# Patient Record
Sex: Female | Born: 1995 | Race: White | Hispanic: No | Marital: Single | Smoking: Never smoker
Health system: Southern US, Community
[De-identification: ages and names within clinical notes are randomized; demographics above are authoritative.]

---

## 2017-04-03 ENCOUNTER — Ambulatory Visit (HOSPITAL_COMMUNITY)
Admission: EM | Admit: 2017-04-03 | Discharge: 2017-04-03 | Disposition: A | Discharge status: Home / Self Care | Payer: 59 | Attending: Internal Medicine | Admitting: Internal Medicine

## 2017-04-03 ENCOUNTER — Ambulatory Visit (INDEPENDENT_AMBULATORY_CARE_PROVIDER_SITE_OTHER): Payer: 59

## 2017-04-03 ENCOUNTER — Encounter (HOSPITAL_COMMUNITY): Payer: Self-pay | Admitting: Family Medicine

## 2017-04-03 DIAGNOSIS — J209 Acute bronchitis, unspecified: Secondary | ICD-10-CM

## 2017-04-03 MED ORDER — PREDNISONE 50 MG PO TABS: ORAL_TABLET | ORAL | 0 refills | Status: AC

## 2017-04-03 MED ORDER — ALBUTEROL SULFATE HFA 108 (90 BASE) MCG/ACT IN AERS: 1.0000 | INHALATION_SPRAY | Freq: Four times a day (QID) | RESPIRATORY_TRACT | 0 refills | Status: AC | PRN

## 2017-04-03 MED ORDER — AZITHROMYCIN 250 MG PO TABS: 250.0000 mg | ORAL_TABLET | Freq: Every day | ORAL | 0 refills | Status: AC

## 2017-04-03 MED ORDER — BENZONATATE 100 MG PO CAPS: 100.0000 mg | ORAL_CAPSULE | Freq: Three times a day (TID) | ORAL | 0 refills | Status: AC

## 2017-04-03 NOTE — ED Triage Notes (Signed)
Pt here for cough, congestion and green mucous. sts pain in her chest from coughing and trouble sleeping.

## 2017-04-03 NOTE — ED Provider Notes (Signed)
CSN: [id number]     Arrival date & time 04/03/17  1221 History   First MD Initiated Contact with Patient 04/03/17 1324     Chief Complaint  Patient presents with  . Cough   (Consider location/radiation/quality/duration/timing/severity/associated sxs/prior Treatment) The history is provided by the patient.  Cough  Cough characteristics:  Non-productive, dry, hacking and harsh Sputum characteristics:  Clear and green Severity:  Moderate Onset quality:  Gradual Duration:  3 weeks Timing:  Constant Progression:  Worsening Chronicity:  New Smoker: no   Context: animal exposure, exposure to allergens and smoke exposure   Context: not occupational exposure, not sick contacts, not upper respiratory infection, not weather changes and not with activity   Relieved by:  Nothing Worsened by:  Lying down Ineffective treatments:  Cough suppressants and decongestant Associated symptoms: shortness of breath and wheezing   Associated symptoms: no chest pain, no chills, no ear fullness, no ear pain, no fever, no headaches, no rhinorrhea, no sinus congestion and no weight loss     History reviewed. No pertinent past medical history. History reviewed. No pertinent surgical history. History reviewed. No pertinent family history. Social History  Substance Use Topics  . Smoking status: Never Smoker  . Smokeless tobacco: Never Used  . Alcohol use Not on file   OB History    No data available     Review of Systems  Constitutional: Negative for chills, fever and weight loss.  HENT: Negative for ear pain and rhinorrhea.   Respiratory: Positive for cough, shortness of breath and wheezing.   Cardiovascular: Negative for chest pain and palpitations.  Gastrointestinal: Negative.   Genitourinary: Negative.   Musculoskeletal: Negative.   Skin: Negative.   Neurological: Negative for light-headedness and headaches.    Allergies  Patient has no known allergies.  Home Medications   Prior to  Admission medications   Medication Sig Start Date End Date Taking? Authorizing Provider  albuterol (PROVENTIL HFA;VENTOLIN HFA) 108 (90 Base) MCG/ACT inhaler Inhale 1-2 puffs into the lungs every 6 (six) hours as needed for wheezing or shortness of breath. 04/03/17   Dorena BodoKennard, Jacolyn Joaquin, NP  azithromycin (ZITHROMAX) 250 MG tablet Take 1 tablet (250 mg total) by mouth daily. Take first 2 tablets together, then 1 every day until finished. 04/03/17   Dorena BodoKennard, Franshesca Chipman, NP  benzonatate (TESSALON) 100 MG capsule Take 1 capsule (100 mg total) by mouth every 8 (eight) hours. 04/03/17   Dorena BodoKennard, Grete Bosko, NP  predniSONE (DELTASONE) 50 MG tablet Take 1 tablet daily with food 04/03/17   Dorena BodoKennard, Deeana Atwater, NP   Meds Ordered and Administered this Visit  Medications - No data to display  BP 128/81 (BP Location: Left Arm)   Pulse 80   Temp 98.4 F (36.9 C) (Oral)   Resp 16   LMP 03/20/2017 (Exact Date)   SpO2 100%  No data found.   Physical Exam  Constitutional: She is oriented to person, place, and time. She appears well-developed and well-nourished. No distress.  HENT:  Head: Normocephalic and atraumatic.  Right Ear: External ear normal.  Left Ear: External ear normal.  Eyes: Conjunctivae are normal.  Neck: Normal range of motion.  Cardiovascular: Normal rate and regular rhythm.   Pulmonary/Chest: Effort normal and breath sounds normal.  Abdominal: Soft. Bowel sounds are normal.  Lymphadenopathy:    She has no cervical adenopathy.  Neurological: She is alert and oriented to person, place, and time.  Skin: Skin is warm and dry. Capillary refill takes less than 2 seconds. No  rash noted. She is not diaphoretic. No erythema.  Psychiatric: She has a normal mood and affect. Her behavior is normal.  Nursing note and vitals reviewed.   Urgent Care Course     Procedures (including critical care time)  Labs Review Labs Reviewed - No data to display  Imaging Review Dg Chest 2 View  Result Date:  04/03/2017 CLINICAL DATA:  Persistent cough for the past 3 weeks, productive in the evenings. No fever. EXAM: CHEST  2 VIEW COMPARISON:  None. FINDINGS: Normal sized heart. Clear lungs. Mild central peribronchial thickening. Mild scoliosis. IMPRESSION: Mild bronchitic changes. Electronically Signed   By: Beckie Salts M.D.   On: 04/03/2017 13:46      MDM   1. Acute bronchitis, unspecified organism    Treating for acute bronchitis, started on azithromycin, Tessalon, prednisone, and given albuterol. Recommend over-the-counter antihistamine daily, given referral to community health and wellness, return to clinic as needed.    Dorena Bodo, NP 04/03/17 2026

## 2017-04-03 NOTE — Discharge Instructions (Signed)
I am treating you for bronchitis. I have prescribed Azithromycin. Take 2 tablets today, then 1 tablet daily till finished. I have also prescribed a steroid called prednisone. Take one tablet daily with food. For cough, I have prescribed a medication called Tessalon. Take 1 tablet every 8 hours as needed for your cough. I also provided an albuterol inhaler, do 1-2 puffs every 4-6 hours as needed for wheezing and shortness of breath. I also recommend an over-the-counter antihistamine such as Claritin, Allegra, or Zyrtec, daily for the remainder of allergy season. Should your symptoms fail to improve or worsen, follow up with your primary care provider, or return to clinic.

## 2019-10-13 ENCOUNTER — Other Ambulatory Visit: Payer: Self-pay | Admitting: Cardiology

## 2019-10-13 DIAGNOSIS — Z20822 Contact with and (suspected) exposure to covid-19: Secondary | ICD-10-CM

## 2019-10-14 LAB — NOVEL CORONAVIRUS, NAA: SARS-CoV-2, NAA: NOT DETECTED

## 2020-12-24 ENCOUNTER — Other Ambulatory Visit: Payer: Self-pay | Admitting: Family Medicine

## 2020-12-24 ENCOUNTER — Ambulatory Visit
Admission: RE | Admit: 2020-12-24 | Discharge: 2020-12-24 | Disposition: A | Discharge status: Home / Self Care | Payer: BC Managed Care – PPO | Source: Ambulatory Visit | Attending: Family Medicine | Admitting: Family Medicine

## 2020-12-24 ENCOUNTER — Other Ambulatory Visit: Payer: Self-pay

## 2020-12-24 DIAGNOSIS — Z1231 Encounter for screening mammogram for malignant neoplasm of breast: Secondary | ICD-10-CM

## 2020-12-27 ENCOUNTER — Other Ambulatory Visit: Payer: Self-pay | Admitting: Family Medicine

## 2020-12-27 DIAGNOSIS — R928 Other abnormal and inconclusive findings on diagnostic imaging of breast: Secondary | ICD-10-CM

## 2020-12-31 ENCOUNTER — Ambulatory Visit
Admission: RE | Admit: 2020-12-31 | Discharge: 2020-12-31 | Disposition: A | Discharge status: Home / Self Care | Payer: BC Managed Care – PPO | Source: Ambulatory Visit | Attending: Family Medicine | Admitting: Family Medicine

## 2020-12-31 ENCOUNTER — Other Ambulatory Visit: Payer: Self-pay | Admitting: Family Medicine

## 2020-12-31 ENCOUNTER — Other Ambulatory Visit: Payer: Self-pay

## 2020-12-31 DIAGNOSIS — R928 Other abnormal and inconclusive findings on diagnostic imaging of breast: Secondary | ICD-10-CM

## 2021-01-02 ENCOUNTER — Ambulatory Visit
Admission: RE | Admit: 2021-01-02 | Discharge: 2021-01-02 | Disposition: A | Discharge status: Home / Self Care | Payer: BC Managed Care – PPO | Source: Ambulatory Visit | Attending: Family Medicine | Admitting: Family Medicine

## 2021-01-02 ENCOUNTER — Other Ambulatory Visit: Payer: Self-pay

## 2021-01-02 DIAGNOSIS — R928 Other abnormal and inconclusive findings on diagnostic imaging of breast: Secondary | ICD-10-CM

## 2021-07-07 ENCOUNTER — Other Ambulatory Visit: Payer: BC Managed Care – PPO

## 2022-03-09 IMAGING — US US BREAST*L* LIMITED INC AXILLA
1 series · 9 of 9 positions shown · non-contrast
Comparison: 12/24/2020

CLINICAL DATA: Patient returns after baseline screening study for
evaluation of possible bilateral breast masses. Patient's mother was
diagnosed with breast cancer at age 35. Genetic testing was not
performed on her mother. The patient and her sister may be
interested in genetic testing.



[Series 1: us breast*left* limited inc axilla · 0.06mm/px · 9 of 9 slices shown]
[im 1/9]
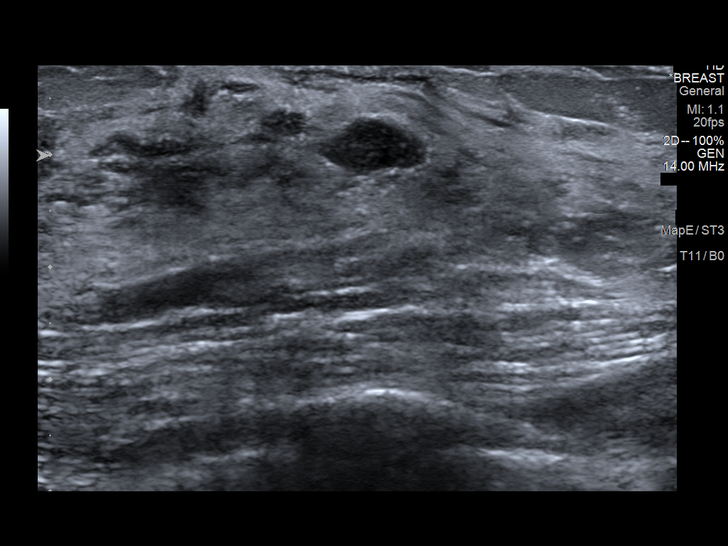
[im 2/9]
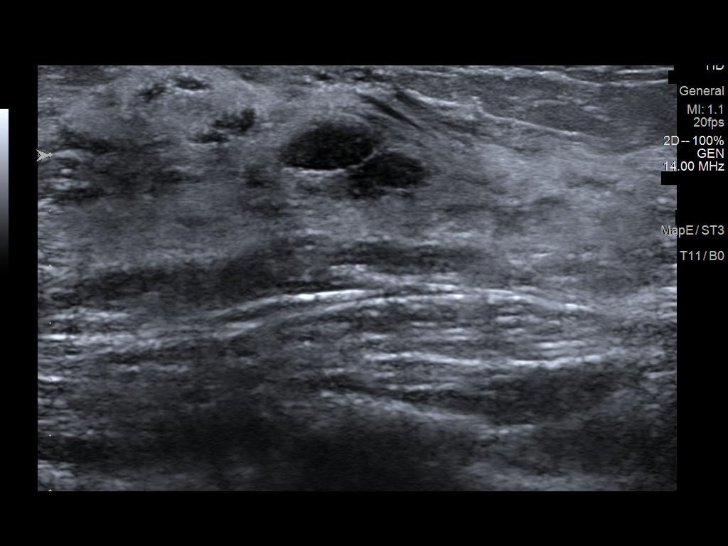
[im 3/9]
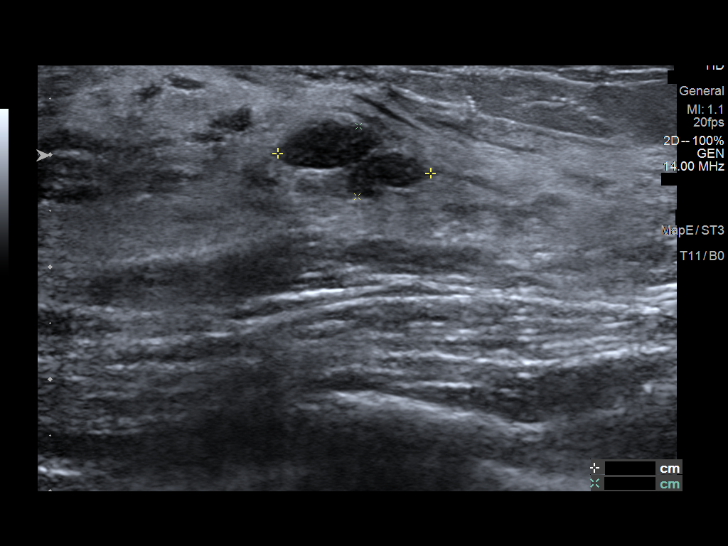
[im 4/9]
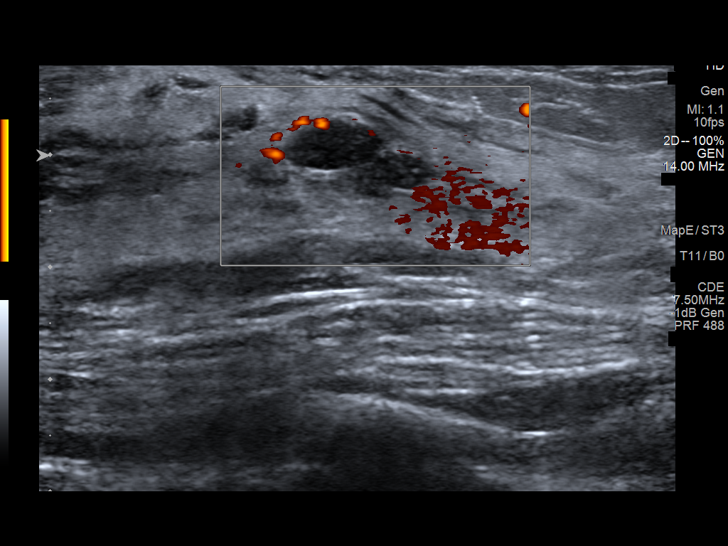
[im 5/9]
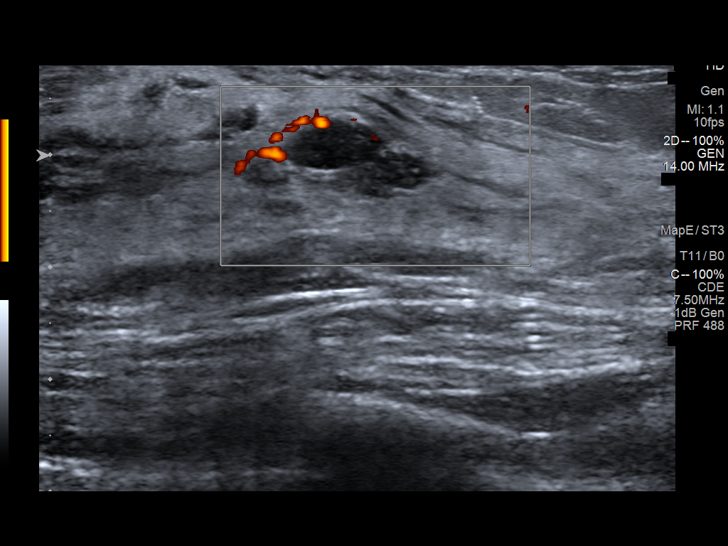
[im 6/9]
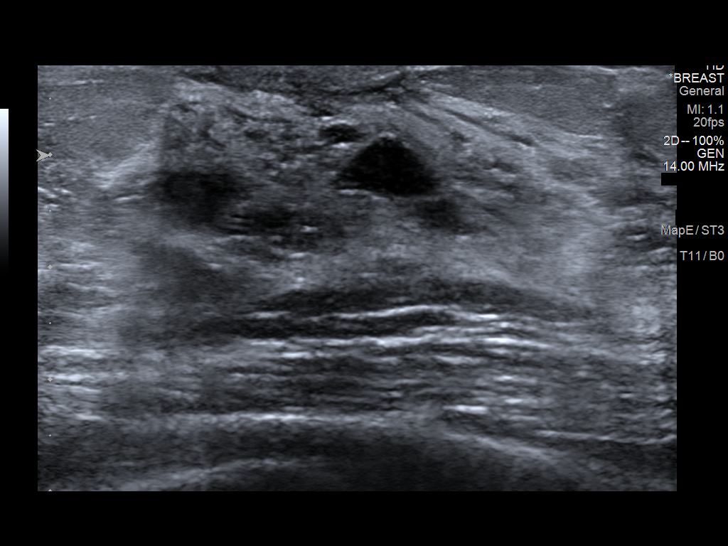
[im 7/9]
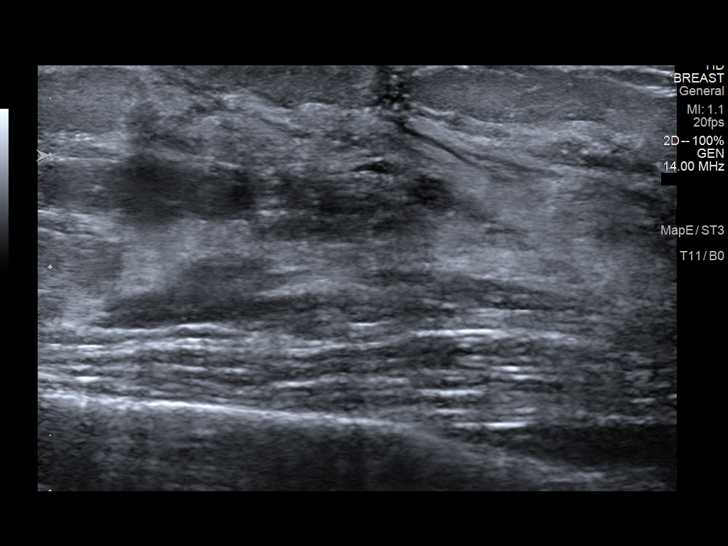
[im 8/9]
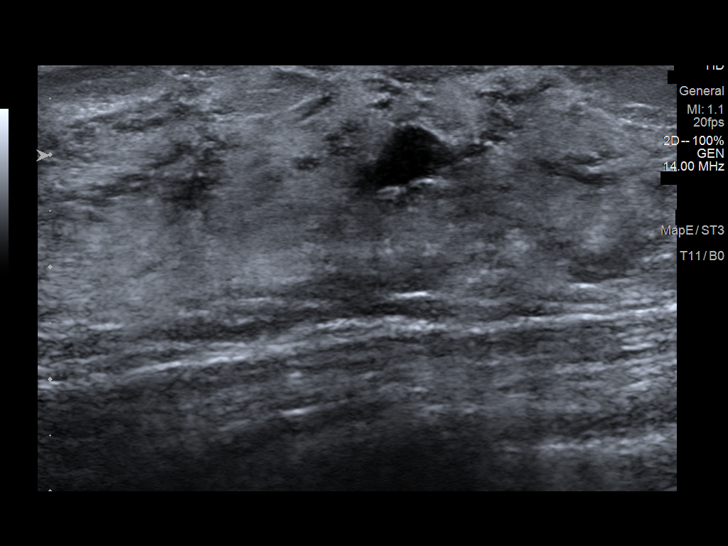
[im 9/9]
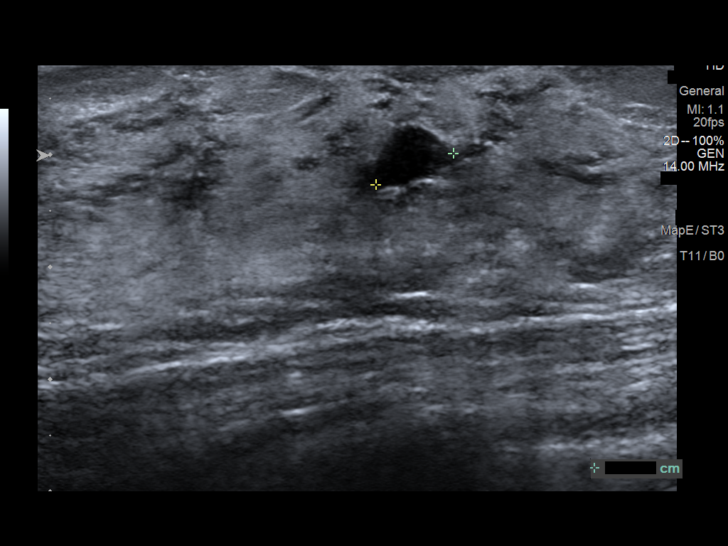

[9 of 9 positions shown; findings below may reference images not displayed]

ACR Breast Density Category c: The breast tissue is heterogeneously
dense, which may obscure small masses.
FINDINGS: RIGHT BREAST:

Mammogram: Additional 2-D and 3-D images are performed. These views
confirm presence of an oval circumscribed mass in the LATERAL
portion of the RIGHT breast and evaluated with ultrasound.
Mammographic images were processed with CAD.

Ultrasound: Targeted ultrasound is performed, showing a
circumscribed oval hypoechoic parallel mass in the 9:30 o'clock
location of the RIGHT breast 5 centimeters from the nipple measuring
1.1 x 0.7 x 1.1 centimeters. No internal blood flow identified by
Doppler evaluation.

LEFT BREAST:

Mammogram: Additional 2-D and 3-D images are performed. These views
show possible obscured mass in the immediate retroareolar location.
Mammographic images were processed with CAD.

Ultrasound: Targeted ultrasound is performed, showing a bilobed
hypoechoic oval circumscribed parallel mass in the 1 o'clock
retroareolar region of the LEFT breast which measures 1.4 x 0.6 x
0.7 centimeters. Internal blood flow is confirmed on Doppler
evaluation.
IMPRESSION: 1. Probably benign mass in the 9:30 o'clock location of the RIGHT
breast.
2. Probably benign mass in the 1 o'clock retroareolar region of the
LEFT breast.
3. We discussed management options including excision, biopsy, and
close follow-up. Given the patient's strong family history of breast
cancer, she elects ultrasound-guided core biopsy of both lesions.

RECOMMENDATION:
Ultrasound-guided core biopsy of RIGHT breast mass 9:30 o'clock
location.

Ultrasound-guided core biopsy of LEFT breast mass 1 o'clock
location.

Recommend targeted axillary ultrasound to exclude axillary
adenopathy prior to biopsies as this was not performed today.

I have discussed the findings and recommendations with the patient.
If applicable, a reminder letter will be sent to the patient
regarding the next appointment.

BI-RADS CATEGORY  3: Probably benign.

## 2022-03-11 IMAGING — US US BREAST BX W LOC DEV 1ST LESION IMG BX SPEC US GUIDE*L*
2 series · 11 of 11 positions shown · non-contrast
Comparison: Previous exam(s).
COMPARISON: Previous exam(s).

Addendum:
CLINICAL DATA: 24-year-old female with strong family history of
breast cancer and bilateral breast masses, in the right breast at
the [DATE] position and in the left breast at the 1 o'clock position.

EXAM:
ULTRASOUND GUIDED BILATERAL BREAST CORE NEEDLE BIOPSY

[Series 1: us breast bx w loc dev 1st lesion img bx spec us g · 0.06mm/px · 9 of 9 slices shown (1 of 2)]
[im 1/9]
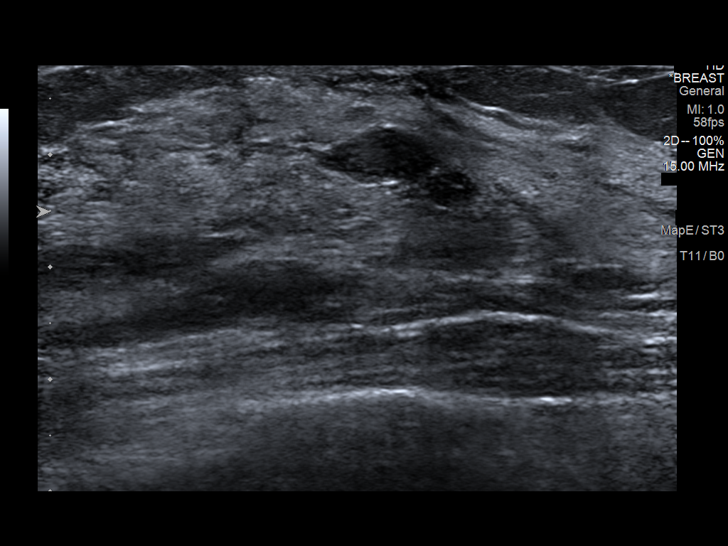
[im 2/9]
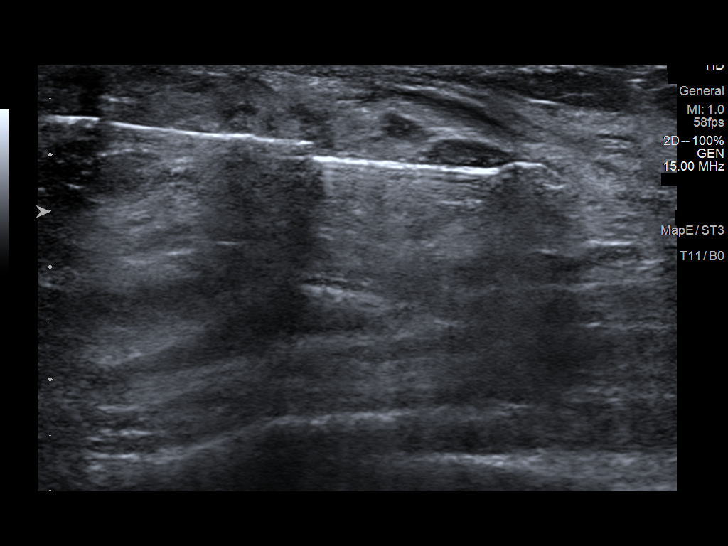
[im 3/9]
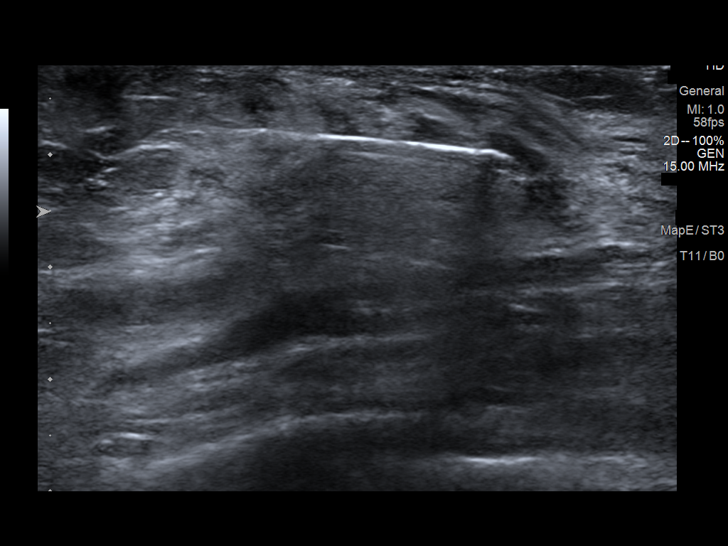
[im 4/9]
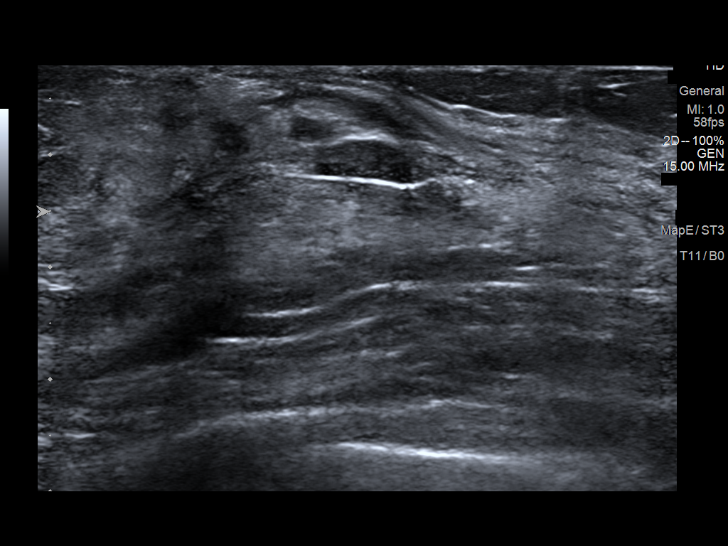
[im 5/9]
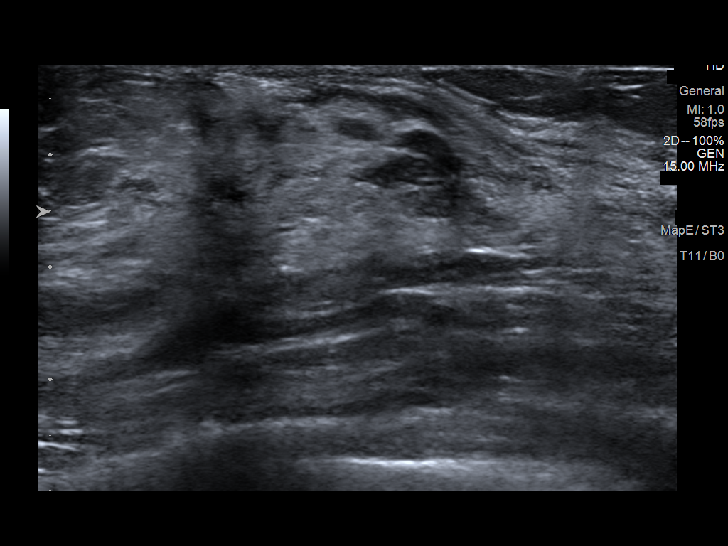
[im 6/9]
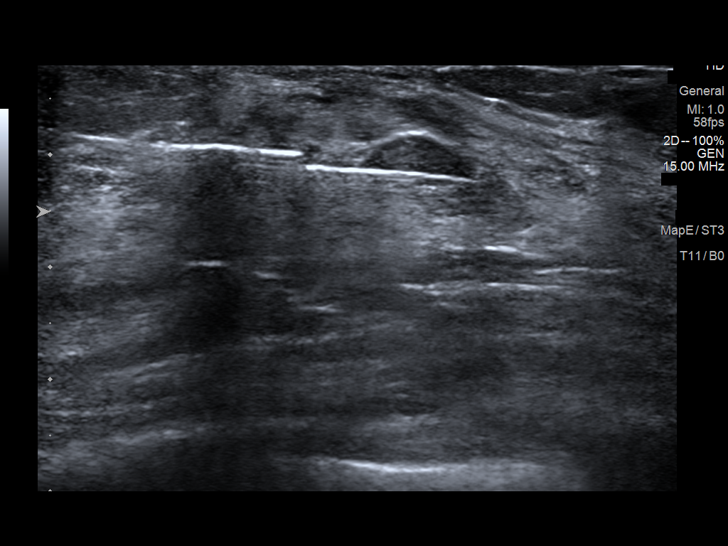
[im 7/9]
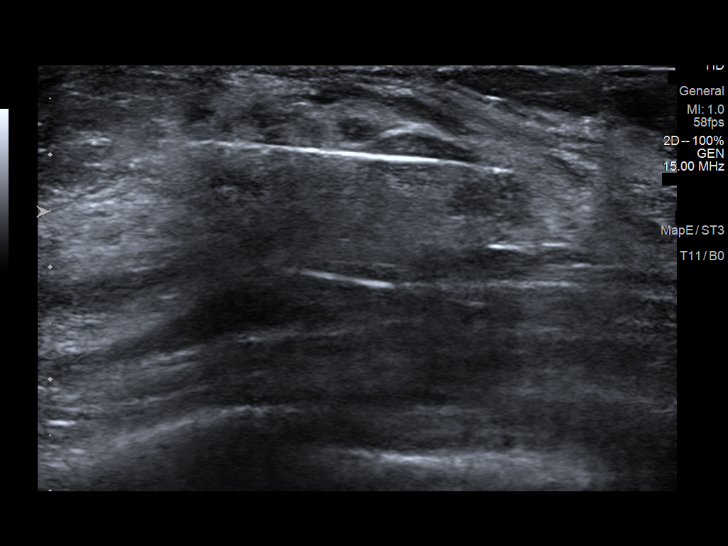
[im 8/9]
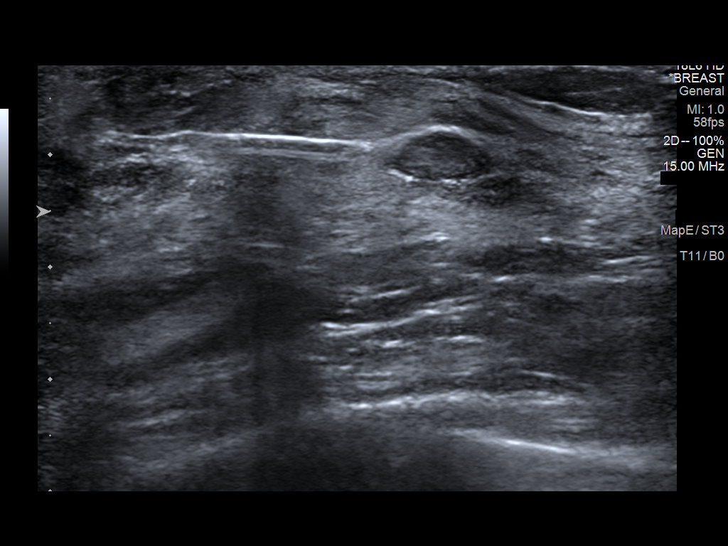
[im 9/9]
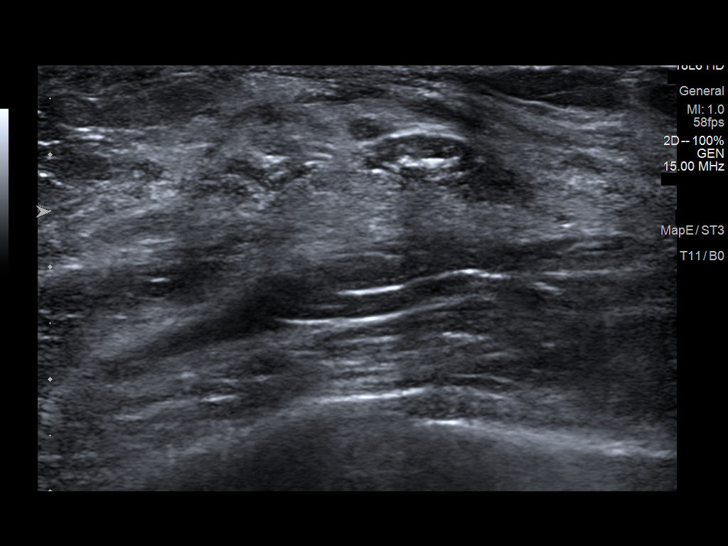

[Series 2: us breast bx w loc dev 1st lesion img bx spec us g · 0.08mm/px · 2 of 2 slices shown (2 of 2)]
[im 1/2]
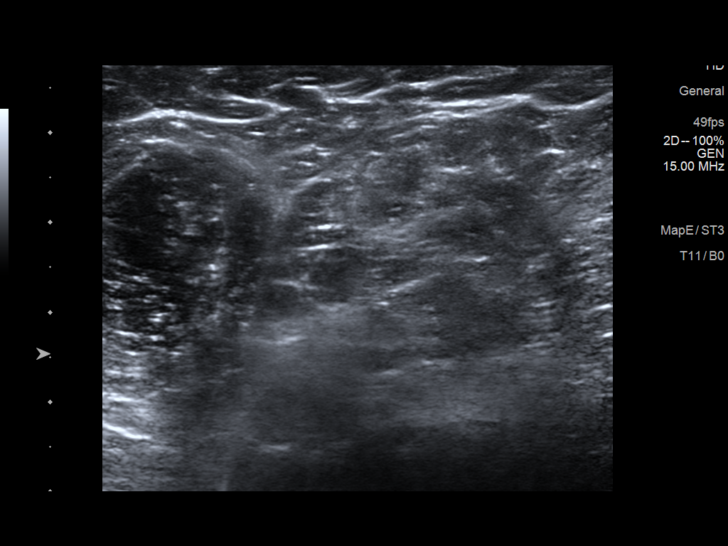
[im 2/2]
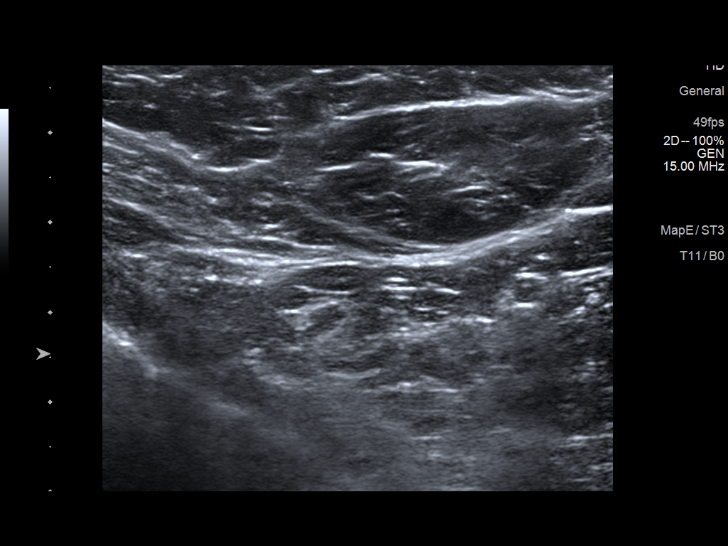

[11 of 11 positions shown; findings below may reference images not displayed]



BILATERAL AXILLA ULTRASOUND: Prior to proceeding with bilateral
breast biopsies, bilateral axillary ultrasound was performed. No
morphologically abnormal lymph nodes identified in either axilla.

RIGHT BREAST [DATE] POSITION: MASS: Lesion quadrant: UPPER-OUTER

Using sterile technique and 1% Lidocaine as local anesthetic, under
direct ultrasound visualization, a 14 gauge [place] device was
used to perform biopsy of the mass in the right breast at the [DATE]
position using a lateral to medial approach. At the conclusion of
the procedure a coil shaped tissue marker clip was deployed into the
biopsy cavity. Post biopsy mammogram was not performed.

LEFT BREAST 1 O'CLOCK POSITION: MASS: Lesion quadrant: UPPER-OUTER

Using sterile technique and 1% Lidocaine as local anesthetic, under
direct ultrasound visualization, a 14 gauge [place] device was
used to perform biopsy of the mass in the left breast at the 1
o'clock position using a medial to lateral approach. At the
conclusion of the procedure a coil shaped tissue marker clip was
deployed into the biopsy cavity. A post biopsy mammogram was not
performed.
IMPRESSION: 1. Ultrasound-guided biopsy of the mass in the right breast at the
[DATE] position.

2. Ultrasound-guided biopsy of the mass in the left breast at the 1
o'clock position.

3.  No axillary lymphadenopathy.

ADDENDUM:
Pathology revealed FIBROADENOMA of the RIGHT breast, 9:30 o'clock.
This was found to be concordant by Dr. Kohhei Revoredo.

Pathology revealed FIBROADENOMA of the LEFT breast, 1 o'clock. This
was found to be concordant by Dr. Kohhei Revoredo.

Pathology results were discussed with the patient by telephone. The
patient reported doing well after the biopsies with tenderness at
the sites. Post biopsy instructions and care were reviewed and
questions were answered. The patient was encouraged to call The

Recommendation is screening mammography in one year and Genetics
testing given family history of breast cancer.

Pathology results reported by Geibson De Holanda RN on 01/06/2021.



BILATERAL AXILLA ULTRASOUND: Prior to proceeding with bilateral
breast biopsies, bilateral axillary ultrasound was performed. No
morphologically abnormal lymph nodes identified in either axilla.

RIGHT BREAST [DATE] POSITION: MASS: Lesion quadrant: UPPER-OUTER

Using sterile technique and 1% Lidocaine as local anesthetic, under
direct ultrasound visualization, a 14 gauge [place] device was
used to perform biopsy of the mass in the right breast at the [DATE]
position using a lateral to medial approach. At the conclusion of
the procedure a coil shaped tissue marker clip was deployed into the
biopsy cavity. Post biopsy mammogram was not performed.

LEFT BREAST 1 O'CLOCK POSITION: MASS: Lesion quadrant: UPPER-OUTER

Using sterile technique and 1% Lidocaine as local anesthetic, under
direct ultrasound visualization, a 14 gauge [place] device was
used to perform biopsy of the mass in the left breast at the 1
o'clock position using a medial to lateral approach. At the
conclusion of the procedure a coil shaped tissue marker clip was
deployed into the biopsy cavity. A post biopsy mammogram was not
performed.
IMPRESSION: 1. Ultrasound-guided biopsy of the mass in the right breast at the
[DATE] position.

2. Ultrasound-guided biopsy of the mass in the left breast at the 1
o'clock position.

3.  No axillary lymphadenopathy.

## 2022-03-11 IMAGING — US US  BREAST BX W/ LOC DEV 1ST LESION IMG BX SPEC US GUIDE*R*
2 series · 16 of 18 positions shown · non-contrast
Comparison: Previous exam(s).
COMPARISON: Previous exam(s).

Addendum:
CLINICAL DATA: 24-year-old female with strong family history of
breast cancer and bilateral breast masses, in the right breast at
the [DATE] position and in the left breast at the 1 o'clock position.

EXAM:
ULTRASOUND GUIDED BILATERAL BREAST CORE NEEDLE BIOPSY

[Series 1: us breast bx w/ loc dev 1st lesion img bx spec us  · 0.07mm/px · 12 of 13 slices shown (1 of 2)]
[im 1/13]
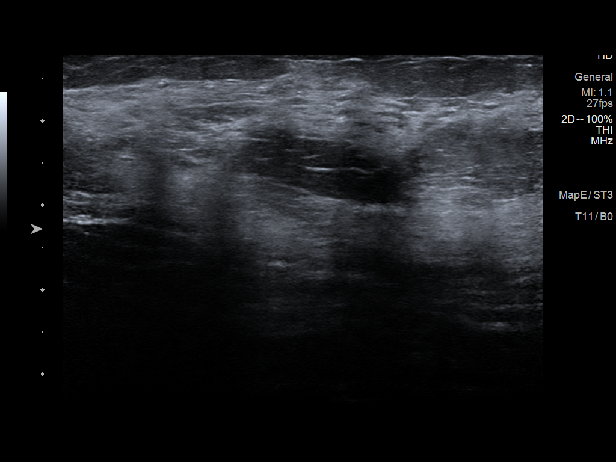
[im 2/13]
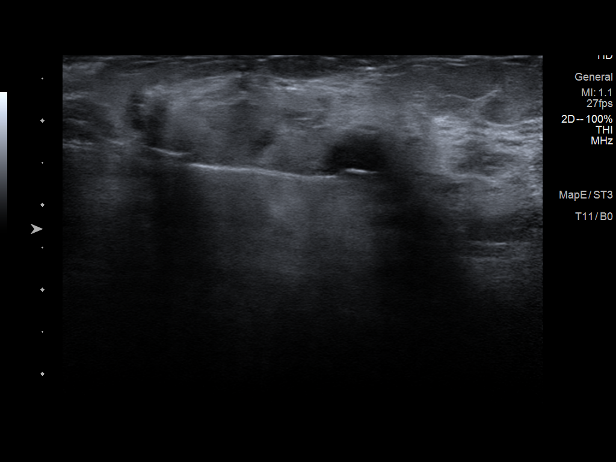
[im 3/13]
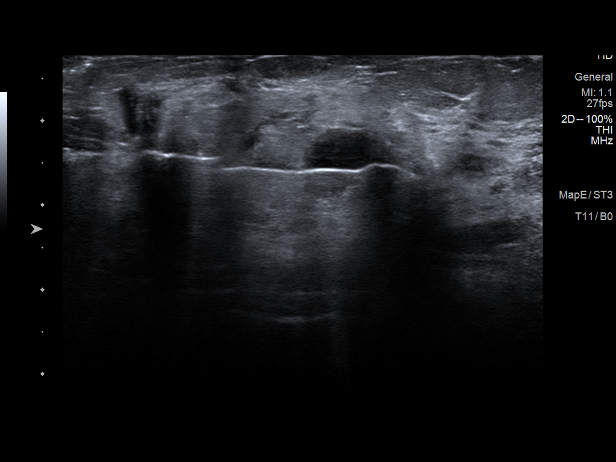
[im 4/13]
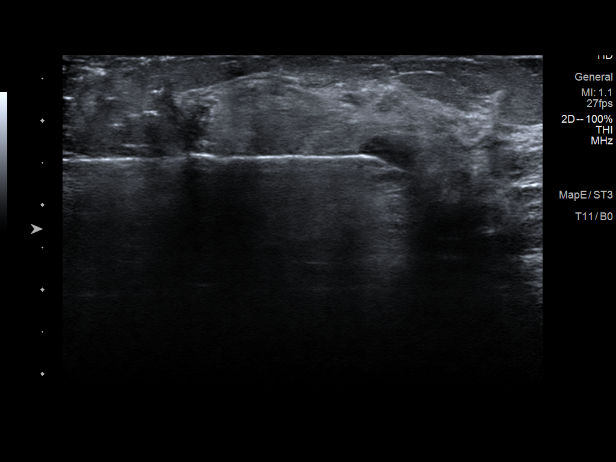
[im 6/13]
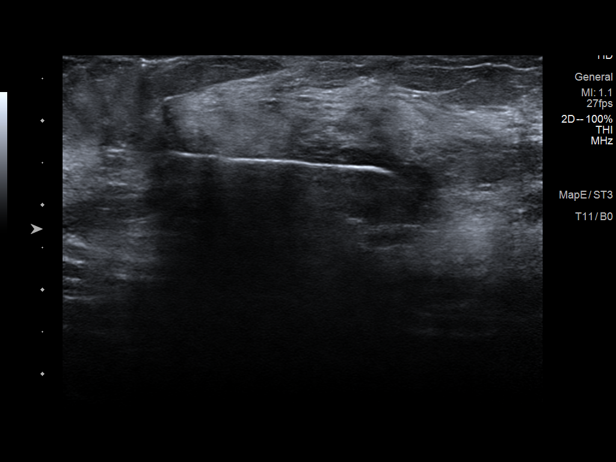
[im 7/13]
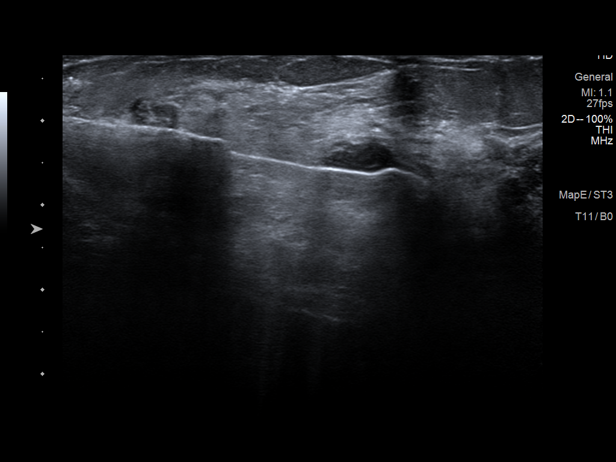
[im 8/13]
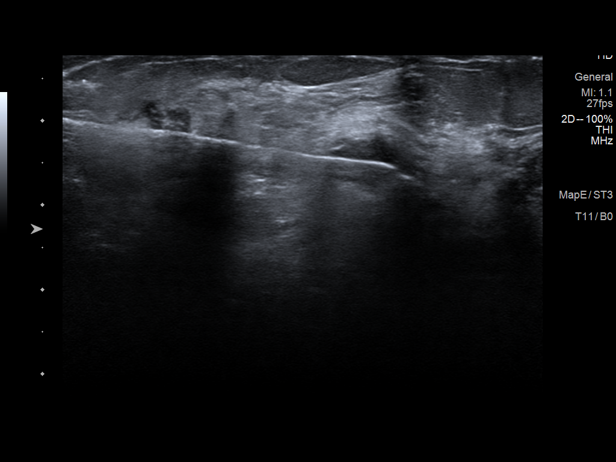
[im 9/13]
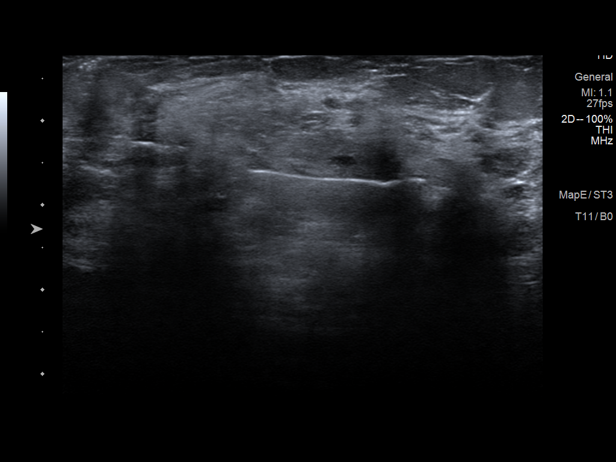
[im 10/13]
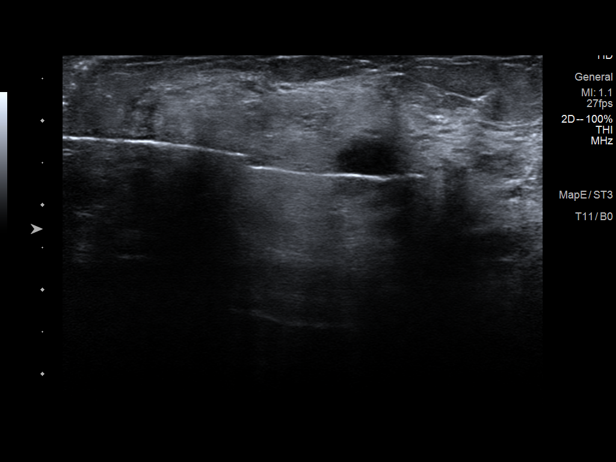
[im 11/13]
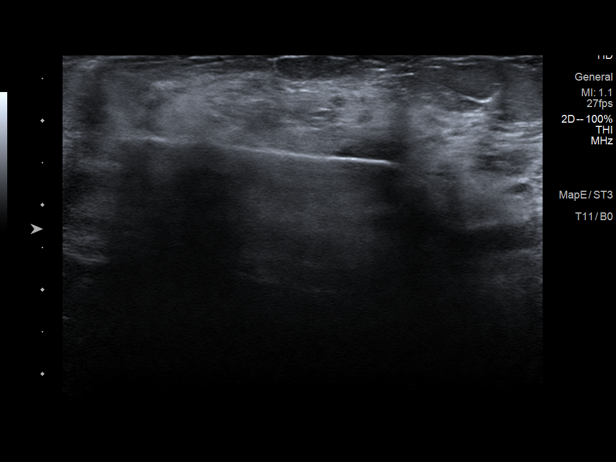
[im 12/13]
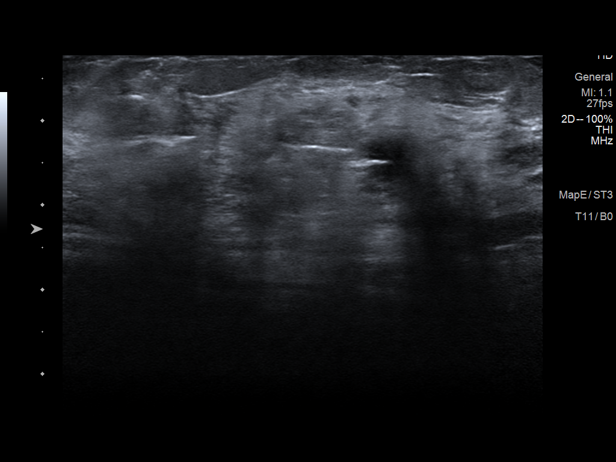
[im 13/13]
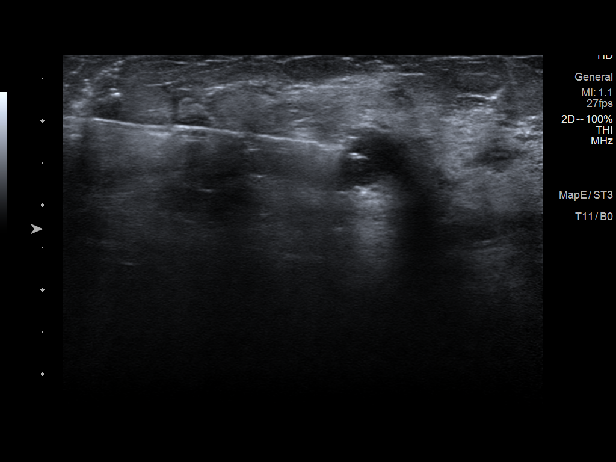

[Series 2: us breast bx w/ loc dev 1st lesion img bx spec us  · 0.08mm/px · 4 of 5 slices shown (2 of 2)]
[im 2/5]
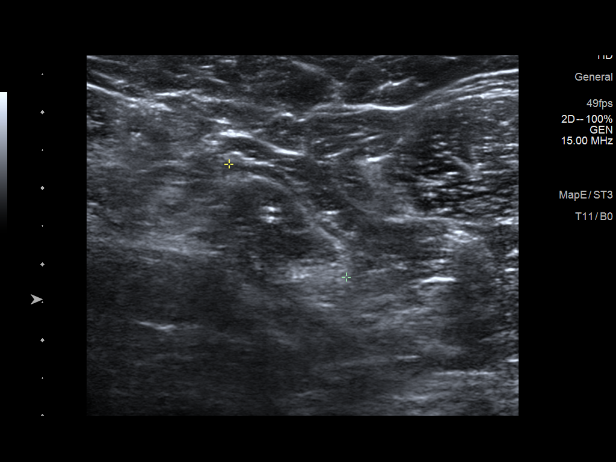
[im 3/5]
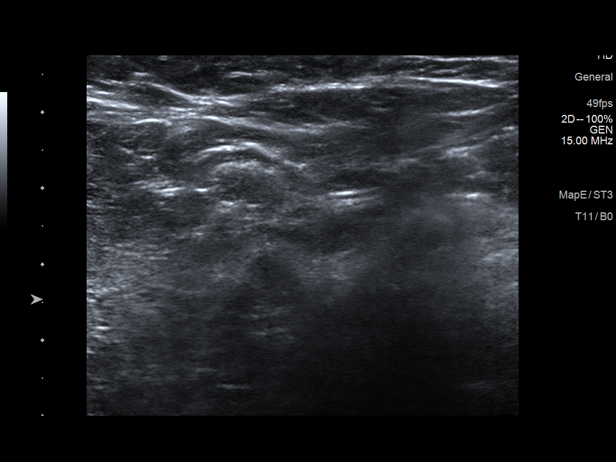
[im 4/5]
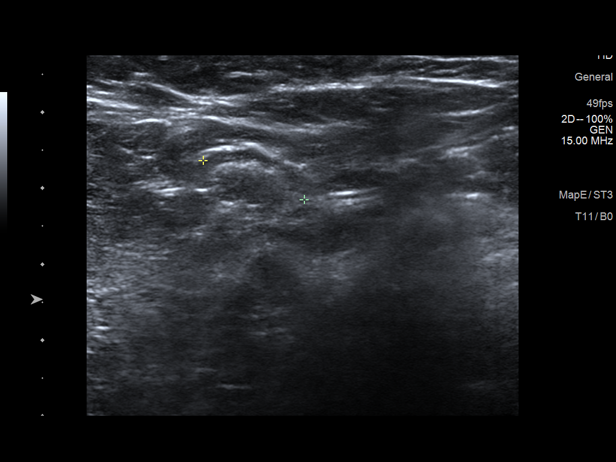
[im 5/5]
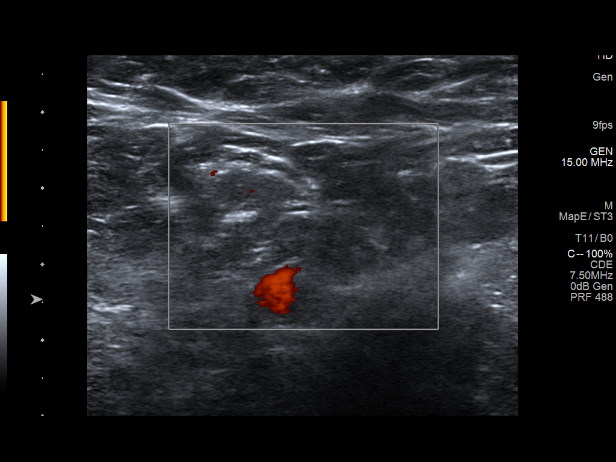

[16 of 18 positions shown; findings below may reference images not displayed]



BILATERAL AXILLA ULTRASOUND: Prior to proceeding with bilateral
breast biopsies, bilateral axillary ultrasound was performed. No
morphologically abnormal lymph nodes identified in either axilla.

RIGHT BREAST [DATE] POSITION: MASS: Lesion quadrant: UPPER-OUTER

Using sterile technique and 1% Lidocaine as local anesthetic, under
direct ultrasound visualization, a 14 gauge [place] device was
used to perform biopsy of the mass in the right breast at the [DATE]
position using a lateral to medial approach. At the conclusion of
the procedure a coil shaped tissue marker clip was deployed into the
biopsy cavity. Post biopsy mammogram was not performed.

LEFT BREAST 1 O'CLOCK POSITION: MASS: Lesion quadrant: UPPER-OUTER

Using sterile technique and 1% Lidocaine as local anesthetic, under
direct ultrasound visualization, a 14 gauge [place] device was
used to perform biopsy of the mass in the left breast at the 1
o'clock position using a medial to lateral approach. At the
conclusion of the procedure a coil shaped tissue marker clip was
deployed into the biopsy cavity. A post biopsy mammogram was not
performed.
IMPRESSION: 1. Ultrasound-guided biopsy of the mass in the right breast at the
[DATE] position.

2. Ultrasound-guided biopsy of the mass in the left breast at the 1
o'clock position.

3.  No axillary lymphadenopathy.

ADDENDUM:
Pathology revealed FIBROADENOMA of the RIGHT breast, 9:30 o'clock.
This was found to be concordant by Dr. Kohhei Revoredo.

Pathology revealed FIBROADENOMA of the LEFT breast, 1 o'clock. This
was found to be concordant by Dr. Kohhei Revoredo.

Pathology results were discussed with the patient by telephone. The
patient reported doing well after the biopsies with tenderness at
the sites. Post biopsy instructions and care were reviewed and
questions were answered. The patient was encouraged to call The

Recommendation is screening mammography in one year and Genetics
testing given family history of breast cancer.

Pathology results reported by Geibson De Holanda RN on 01/06/2021.



BILATERAL AXILLA ULTRASOUND: Prior to proceeding with bilateral
breast biopsies, bilateral axillary ultrasound was performed. No
morphologically abnormal lymph nodes identified in either axilla.

RIGHT BREAST [DATE] POSITION: MASS: Lesion quadrant: UPPER-OUTER

Using sterile technique and 1% Lidocaine as local anesthetic, under
direct ultrasound visualization, a 14 gauge [place] device was
used to perform biopsy of the mass in the right breast at the [DATE]
position using a lateral to medial approach. At the conclusion of
the procedure a coil shaped tissue marker clip was deployed into the
biopsy cavity. Post biopsy mammogram was not performed.

LEFT BREAST 1 O'CLOCK POSITION: MASS: Lesion quadrant: UPPER-OUTER

Using sterile technique and 1% Lidocaine as local anesthetic, under
direct ultrasound visualization, a 14 gauge [place] device was
used to perform biopsy of the mass in the left breast at the 1
o'clock position using a medial to lateral approach. At the
conclusion of the procedure a coil shaped tissue marker clip was
deployed into the biopsy cavity. A post biopsy mammogram was not
performed.
IMPRESSION: 1. Ultrasound-guided biopsy of the mass in the right breast at the
[DATE] position.

2. Ultrasound-guided biopsy of the mass in the left breast at the 1
o'clock position.

3.  No axillary lymphadenopathy.
